# Patient Record
Sex: Female | Born: 1995 | Race: White | Hispanic: No | Marital: Single | Smoking: Never smoker
Health system: Southern US, Community
[De-identification: ages and names within clinical notes are randomized; demographics above are authoritative.]

## PROBLEM LIST (undated history)

## (undated) DIAGNOSIS — F419 Anxiety disorder, unspecified: Secondary | ICD-10-CM

---

## 2020-02-13 ENCOUNTER — Encounter (HOSPITAL_BASED_OUTPATIENT_CLINIC_OR_DEPARTMENT_OTHER): Payer: Self-pay | Admitting: *Deleted

## 2020-02-13 ENCOUNTER — Emergency Department (HOSPITAL_BASED_OUTPATIENT_CLINIC_OR_DEPARTMENT_OTHER): Payer: BC Managed Care – PPO

## 2020-02-13 ENCOUNTER — Other Ambulatory Visit: Payer: Self-pay

## 2020-02-13 ENCOUNTER — Emergency Department (HOSPITAL_BASED_OUTPATIENT_CLINIC_OR_DEPARTMENT_OTHER)
Admission: EM | Admit: 2020-02-13 | Discharge: 2020-02-13 | Disposition: A | Discharge status: Home / Self Care | Payer: BC Managed Care – PPO | Attending: Emergency Medicine | Admitting: Emergency Medicine

## 2020-02-13 DIAGNOSIS — W2209XA Striking against other stationary object, initial encounter: Secondary | ICD-10-CM | POA: Diagnosis not present

## 2020-02-13 DIAGNOSIS — Y9289 Other specified places as the place of occurrence of the external cause: Secondary | ICD-10-CM | POA: Diagnosis not present

## 2020-02-13 DIAGNOSIS — Y999 Unspecified external cause status: Secondary | ICD-10-CM | POA: Diagnosis not present

## 2020-02-13 DIAGNOSIS — Y93B9 Activity, other involving muscle strengthening exercises: Secondary | ICD-10-CM | POA: Diagnosis not present

## 2020-02-13 DIAGNOSIS — Z79899 Other long term (current) drug therapy: Secondary | ICD-10-CM | POA: Diagnosis not present

## 2020-02-13 DIAGNOSIS — S99921A Unspecified injury of right foot, initial encounter: Secondary | ICD-10-CM | POA: Diagnosis present

## 2020-02-13 HISTORY — DX: Anxiety disorder, unspecified: F41.9

## 2020-02-13 NOTE — Discharge Instructions (Signed)
You have been seen today for a foot injury. There were no acute abnormalities on the x-rays, including no sign of fracture or dislocation, however, there could be injuries to the soft tissues, such as the ligaments or tendons that are not seen on xrays. There could also be what are called occult fractures that are small fractures not seen on xray. Antiinflammatory medications: Take 600 mg of ibuprofen every 6 hours or 440 mg (over the counter dose) to 500 mg (prescription dose) of naproxen every 12 hours for the next 3 days. After this time, these medications may be used as needed for pain. Take these medications with food to avoid upset stomach. Choose only one of these medications, do not take them together. Acetaminophen (generic for Tylenol): Should you continue to have additional pain while taking the ibuprofen or naproxen, you may add in acetaminophen as needed. Your daily total maximum amount of acetaminophen from all sources should be limited to 4000mg /day for persons without liver problems, or 2000mg /day for those with liver problems. Ice: May apply ice to the area over the next 24 hours for 15 minutes at a time to reduce swelling. Elevation: Keep the extremity elevated as often as possible to reduce pain and inflammation. Support: Wear the wrap for support and comfort. Wear this until pain resolves. You will be weight-bearing as tolerated, which means you can slowly start to put weight on the extremity and increase amount and frequency as pain allows. Follow up: If symptoms are improving, you may follow up with your primary care provider for any continued management. If symptoms are not starting to improve within a week, you should follow up with the orthopedic specialist within two weeks. Return: Return to the ED for numbness, weakness, increasing pain, overall worsening symptoms, loss of function, or if symptoms are not improving, you have tried to follow up with the orthopedic specialist, and  have been unable to do so.  For prescription assistance, may try using prescription discount sites or apps, such as goodrx.com

## 2020-02-13 NOTE — ED Provider Notes (Signed)
[facility] Provider Note   CSN: [id number] Arrival date & time: 02/13/20  2129     History Chief Complaint  Patient presents with  . Foot Injury    Katrina Green is a 23 y.o. female.  HPI      Katrina Green is a 24 y.o. female, with a history of anxiety, presenting to the ED with right foot injury that occurred shortly prior to arrival.  Patient was performing exercises, did a jump squat, and felt pain in her right foot when she landed.  Pain is throbbing, moderate, nonradiating from the lateral foot. Denies ankle pain, numbness, weakness, other injuries.     Past Medical History:  Diagnosis Date  . Anxiety     There are no problems to display for this patient.   History reviewed. No pertinent surgical history.   OB History   No obstetric history on file.     No family history on file.  Social History   Tobacco Use  . Smoking status: Never Smoker  . Smokeless tobacco: Never Used  Substance Use Topics  . Alcohol use: Never  . Drug use: Never    Home Medications Prior to Admission medications   Medication Sig Start Date End Date Taking? Authorizing Provider  sertraline (ZOLOFT) 50 MG tablet TAKE 1 AND 1/2 TABLETS BY MOUTH DAILY 12/26/19  Yes [provider]    Allergies    Amoxicillin, Penicillin g, and Sulfamethoxazole-trimethoprim  Review of Systems   Review of Systems  Musculoskeletal: Positive for arthralgias.  Neurological: Negative for weakness and numbness.    Physical Exam Updated Vital Signs BP (!) 141/92   Pulse 90   Temp 98.5 F (36.9 C) (Oral)   Resp 20   Ht 5\' 11"  (1.803 m)   Wt 113.4 kg   SpO2 100%   BMI 34.87 kg/m   Physical Exam Vitals and nursing note reviewed.  Constitutional:      General: She is not in acute distress.    Appearance: She is well-developed. She is not diaphoretic.  HENT:     Head: Normocephalic and atraumatic.  Eyes:     Conjunctiva/sclera: Conjunctivae  normal.  Cardiovascular:     Rate and Rhythm: Normal rate and regular rhythm.  Pulmonary:     Effort: Pulmonary effort is normal.  Musculoskeletal:     Cervical back: Neck supple.       Feet:     Comments: No tenderness, swelling, or pain with range of motion to the right knee or right lower leg.  Feet:     Comments: Tenderness, swelling, and bruising to the right lateral foot as shown.  No noted deformity or instability. No tenderness, swelling, color change, deformity, or pain with range of motion to the right ankle.  Skin:    General: Skin is warm and dry.     Capillary Refill: Capillary refill takes less than 2 seconds.     Coloration: Skin is not pale.  Neurological:     Mental Status: She is alert.     Comments: Sensation light touch grossly intact throughout the right foot and lower leg. Strength 5/5 in the right toes, ankle, and knee.  Psychiatric:        Behavior: Behavior normal.     ED Results / Procedures / Treatments   Labs (all labs ordered are listed, but only abnormal results are displayed) Labs Reviewed - No data to display  EKG None  Radiology DG Ankle Complete Right  Result Date: 02/13/2020 CLINICAL DATA:  Ankle injury from while jumping, initial encounter EXAM: RIGHT ANKLE - COMPLETE 3+ VIEW COMPARISON:  None. FINDINGS: There is no evidence of fracture, dislocation, or joint effusion. There is no evidence of arthropathy or other focal bone abnormality. Soft tissues are unremarkable. IMPRESSION: No acute abnormality noted. Electronically Signed   By: Alcide Clever M.D.   On: 02/13/2020 21:57   DG Foot Complete Right  Result Date: 02/13/2020 CLINICAL DATA:  24 year old female landed wrong. Right foot and ankle pain and swelling. EXAM: RIGHT FOOT COMPLETE - 3+ VIEW COMPARISON:  Right ankle series earlier today. FINDINGS: Bone mineralization is within normal limits. There is no evidence of fracture or dislocation. Mild calcaneus degenerative spurring. There is  no evidence of arthropathy or other focal bone abnormality. No discrete soft tissue injury identified. IMPRESSION: Negative. Electronically Signed   By: Odessa Fleming M.D.   On: 02/13/2020 22:24    Procedures Procedures (including critical care time)  Medications Ordered in ED Medications - No data to display  ED Course  I have reviewed the triage vital signs and the nursing notes.  Pertinent labs & imaging results that were available during my care of the patient were reviewed by me and considered in my medical decision making (see chart for details).    MDM Rules/Calculators/A&P                      Patient presents with right foot injury.  No evidence of neurovascular compromise. No evidence of acute osseous abnormality on x-rays. Patient's foot wrapped for support, placed on crutches, orthopedic follow-up as needed. The patient was given instructions for home care as well as return precautions. Patient voices understanding of these instructions, accepts the plan, and is comfortable with discharge.  I reviewed and interpreted the patient's radiological studies.    Final Clinical Impression(s) / ED Diagnoses Final diagnoses:  Injury of right foot, initial encounter    Rx / DC Orders ED Discharge Orders    None       Concepcion Living 02/13/20 2343    Geoffery Lyons, MD 02/14/20 1505

## 2020-02-13 NOTE — ED Triage Notes (Addendum)
Right foot and ankle injury. She jumped and landed wrong on her ankle. Swelling to her foot and ankle.

## 2021-08-09 IMAGING — DX DG FOOT COMPLETE 3+V*R*
3 series · 3 of 3 positions shown · non-contrast
Comparison: Right ankle series earlier today.

CLINICAL DATA: 23-year-old female landed wrong. Right foot and
ankle pain and swelling.

EXAM:
RIGHT FOOT COMPLETE - 3+ VIEW

[foot ap]
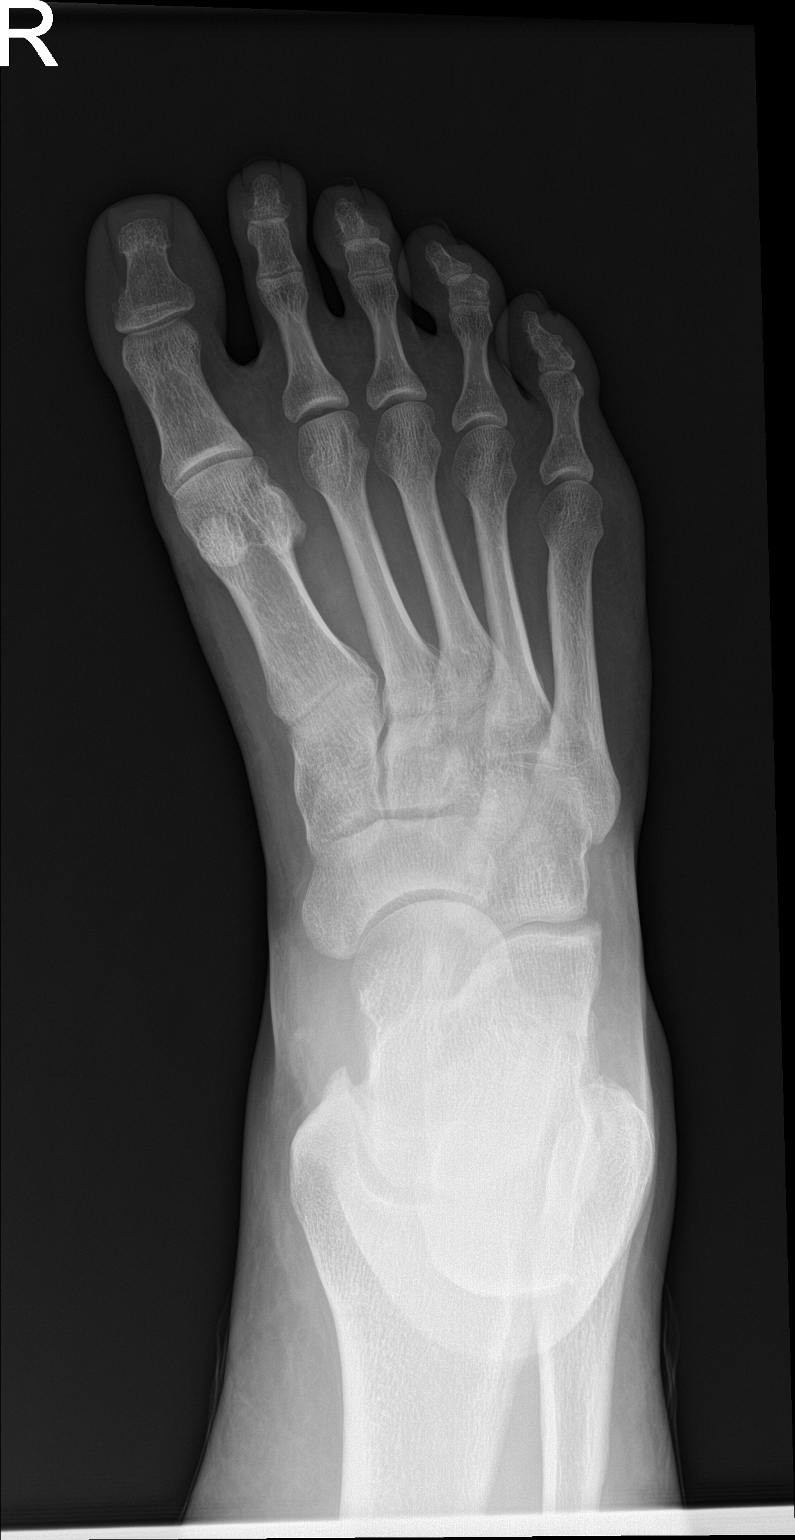

[foot obl]
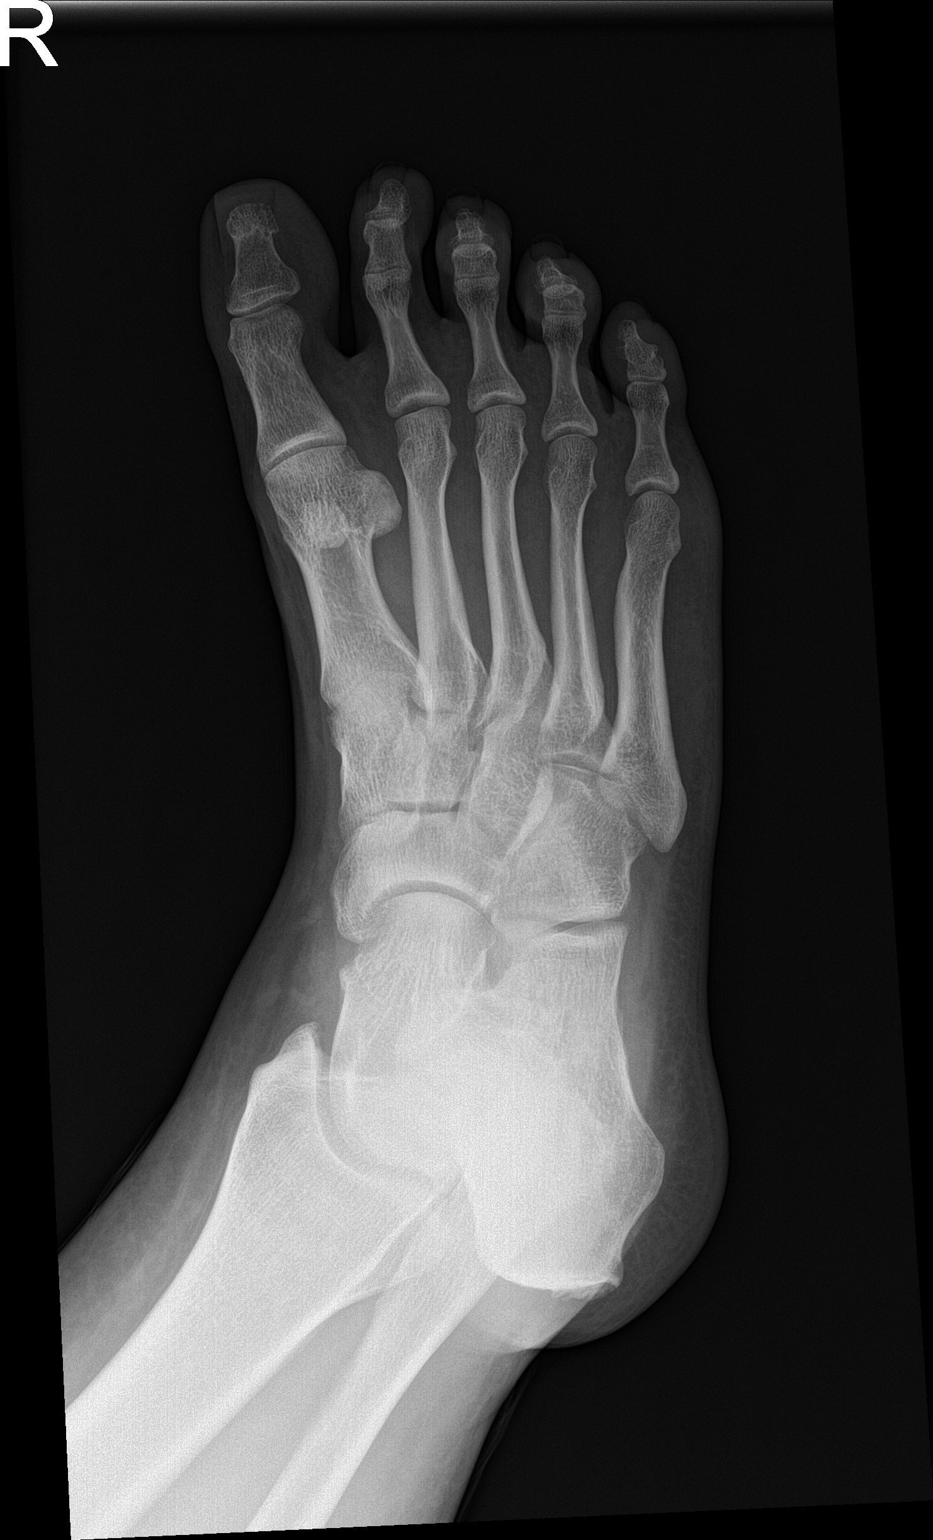

[foot lat]
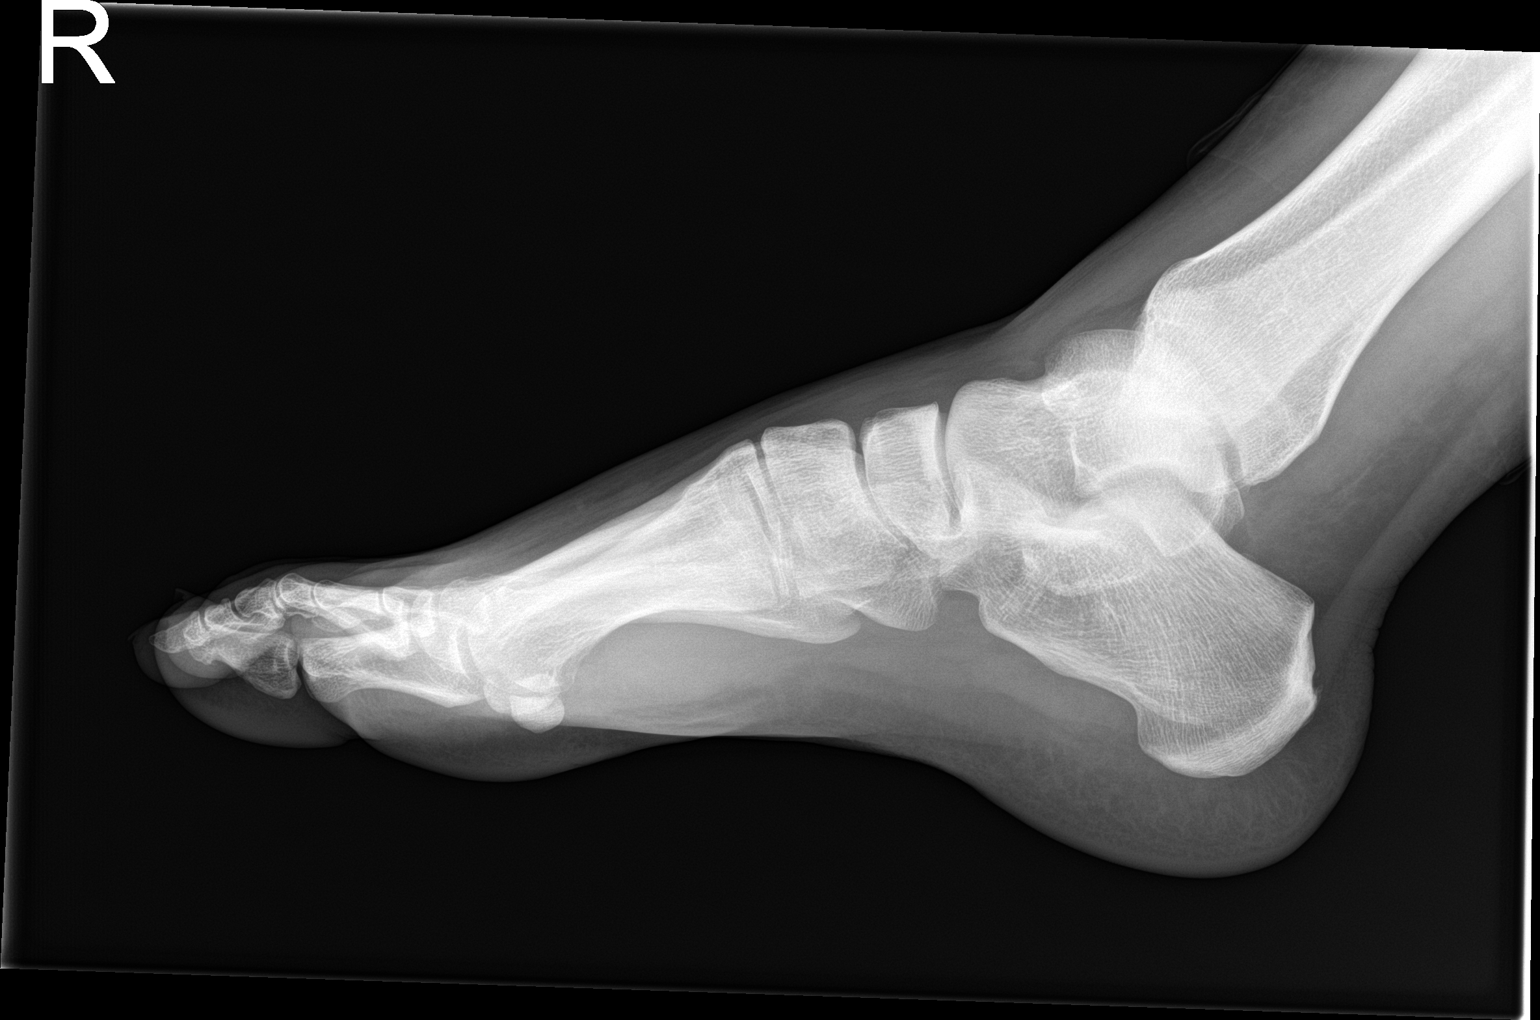

[3 of 3 positions shown; findings below may reference images not displayed]

FINDINGS: Bone mineralization is within normal limits. There is no evidence of
fracture or dislocation. Mild calcaneus degenerative spurring. There
is no evidence of arthropathy or other focal bone abnormality. No
discrete soft tissue injury identified.
IMPRESSION: Negative.

## 2021-08-09 IMAGING — DX DG ANKLE COMPLETE 3+V*R*
3 series · 3 of 3 positions shown · non-contrast
Comparison: None.

CLINICAL DATA: Ankle injury from while jumping, initial encounter

EXAM:
RIGHT ANKLE - COMPLETE 3+ VIEW

[ankle ap]
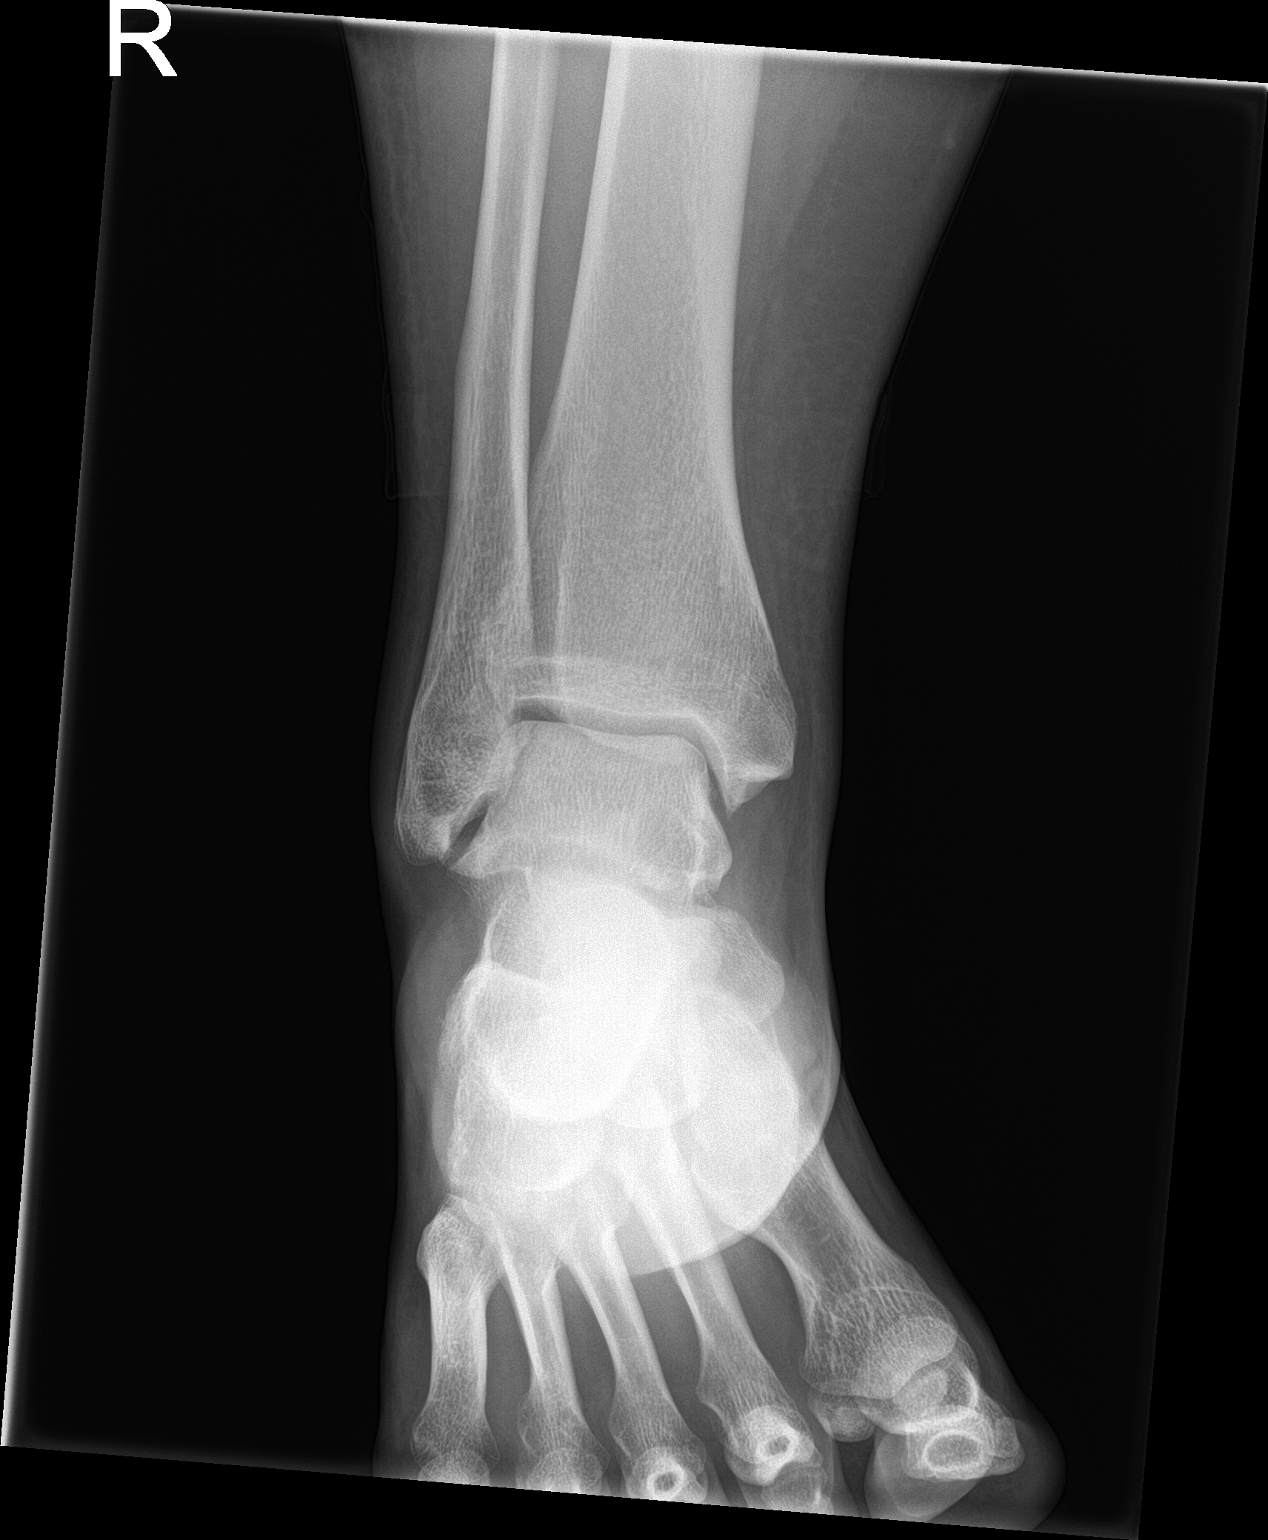

[ankle obl]
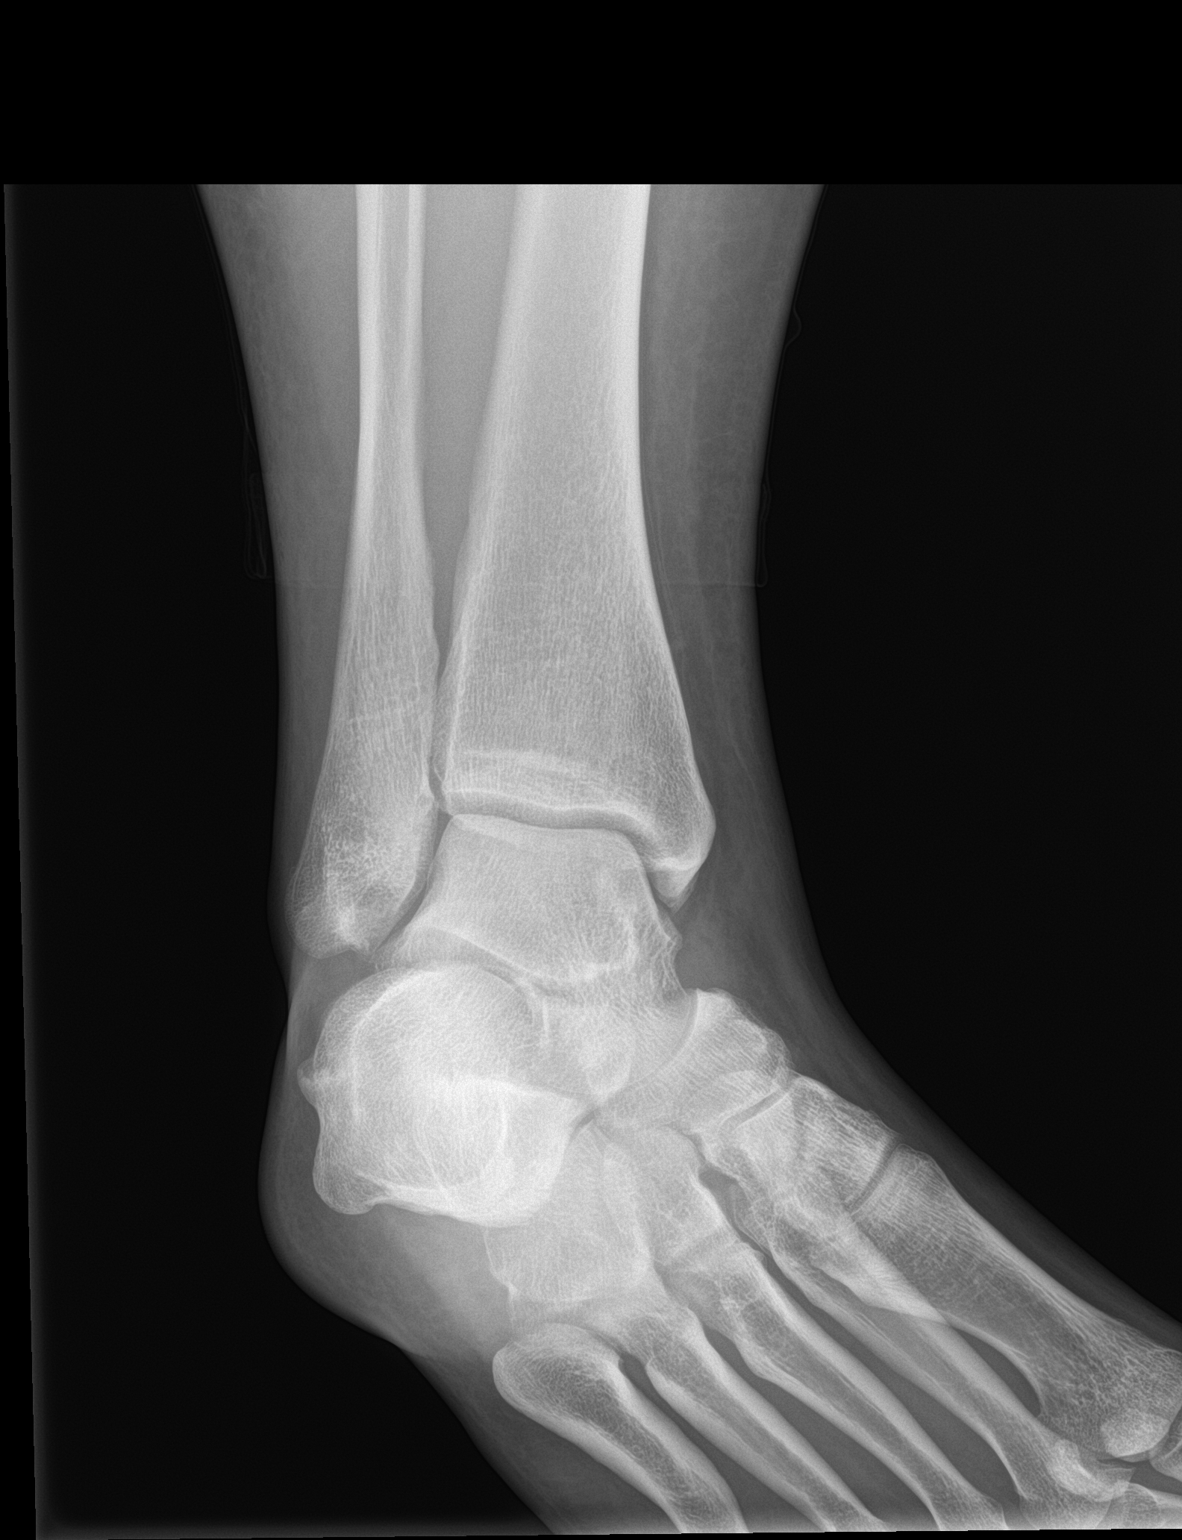

[ankle lat]
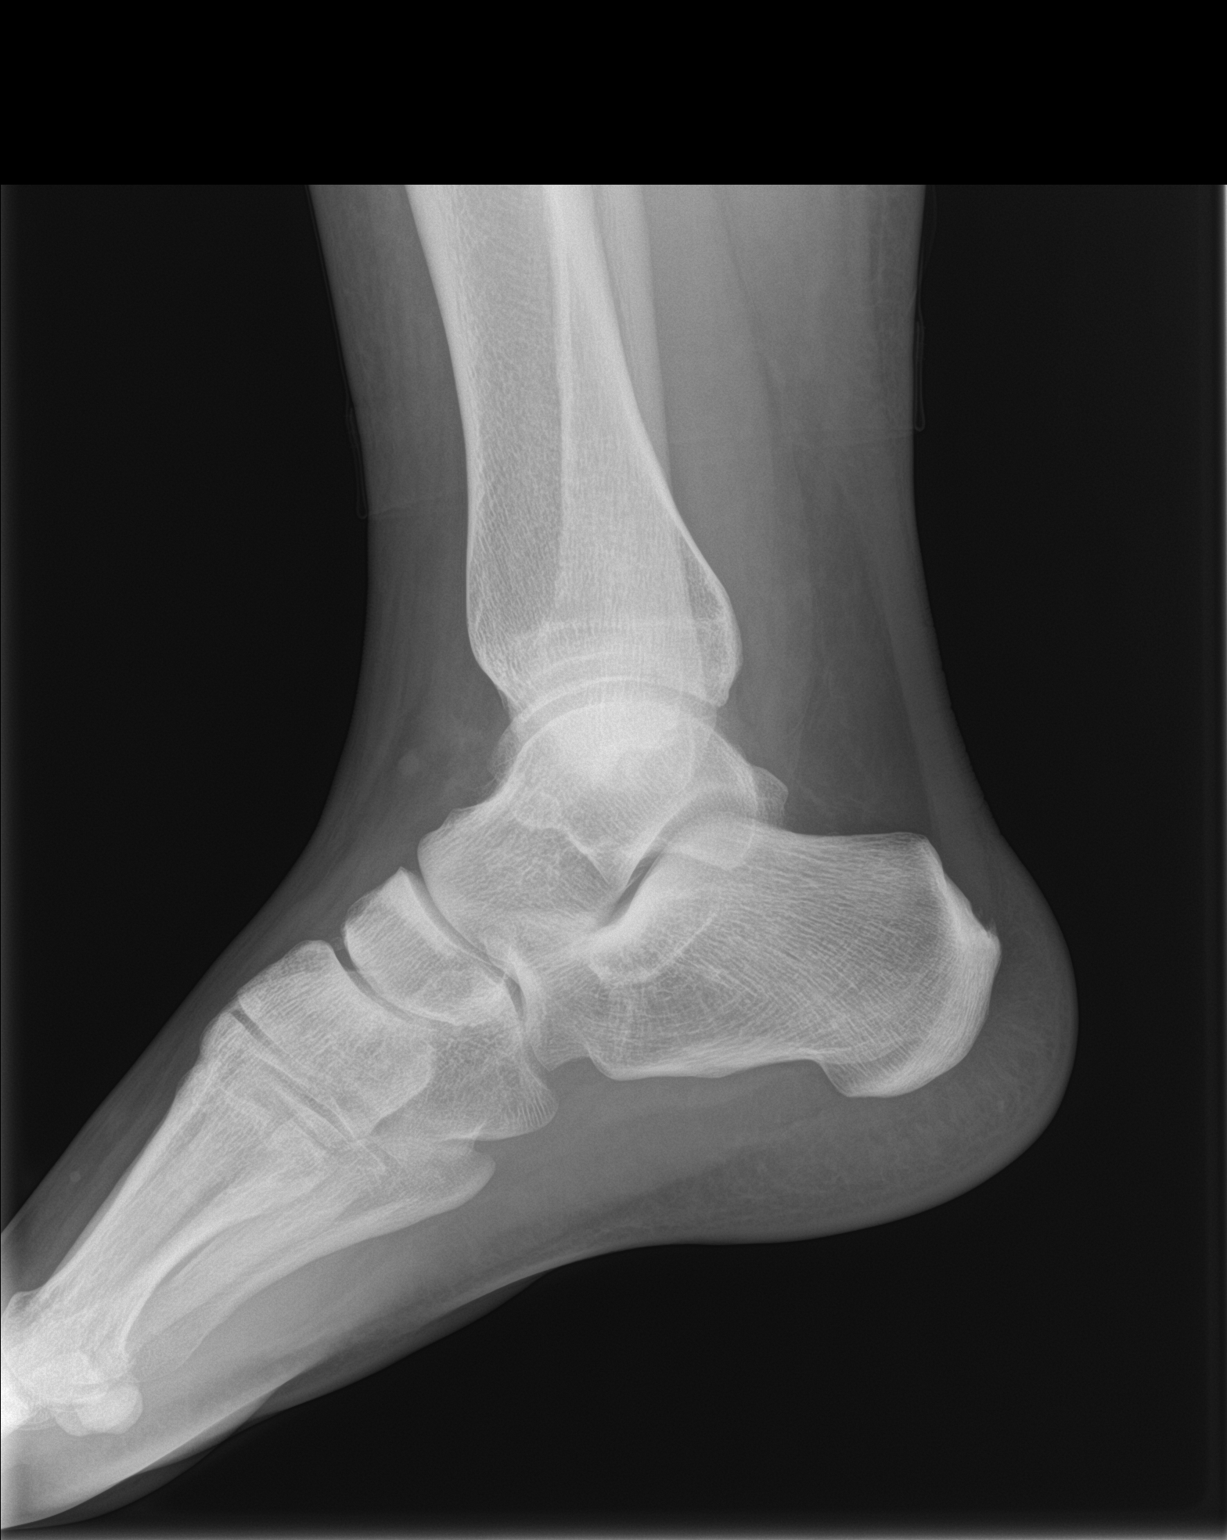

[3 of 3 positions shown; findings below may reference images not displayed]

FINDINGS: There is no evidence of fracture, dislocation, or joint effusion.
There is no evidence of arthropathy or other focal bone abnormality.
Soft tissues are unremarkable.
IMPRESSION: No acute abnormality noted.
